# Patient Record
Sex: Male | Born: 1965 | Race: Asian | Hispanic: No | Marital: Single | Smoking: Never smoker
Health system: Southern US, Community
[De-identification: ages and names within clinical notes are randomized; demographics above are authoritative.]

## PROBLEM LIST (undated history)

## (undated) DIAGNOSIS — E119 Type 2 diabetes mellitus without complications: Secondary | ICD-10-CM

---

## 2000-10-04 ENCOUNTER — Encounter: Admission: RE | Admit: 2000-10-04 | Discharge: 2000-10-04 | Payer: Self-pay | Admitting: *Deleted

## 2000-10-04 ENCOUNTER — Encounter: Payer: Self-pay | Admitting: *Deleted

## 2002-05-20 ENCOUNTER — Encounter: Admission: RE | Admit: 2002-05-20 | Discharge: 2002-05-20 | Payer: Self-pay | Admitting: *Deleted

## 2002-05-20 ENCOUNTER — Encounter: Payer: Self-pay | Admitting: *Deleted

## 2004-06-23 ENCOUNTER — Ambulatory Visit (HOSPITAL_COMMUNITY): Admission: RE | Admit: 2004-06-23 | Discharge: 2004-06-23 | Payer: Self-pay | Admitting: Orthopedic Surgery

## 2004-06-23 ENCOUNTER — Ambulatory Visit (HOSPITAL_BASED_OUTPATIENT_CLINIC_OR_DEPARTMENT_OTHER): Admission: RE | Admit: 2004-06-23 | Discharge: 2004-06-23 | Payer: Self-pay | Admitting: Orthopedic Surgery

## 2018-04-02 ENCOUNTER — Ambulatory Visit (INDEPENDENT_AMBULATORY_CARE_PROVIDER_SITE_OTHER): Payer: 59 | Admitting: Orthopedic Surgery

## 2018-04-02 ENCOUNTER — Ambulatory Visit (INDEPENDENT_AMBULATORY_CARE_PROVIDER_SITE_OTHER): Payer: 59

## 2018-04-02 ENCOUNTER — Encounter (INDEPENDENT_AMBULATORY_CARE_PROVIDER_SITE_OTHER): Payer: Self-pay | Admitting: Orthopedic Surgery

## 2018-04-02 VITALS — Ht 63.0 in | Wt 145.0 lb

## 2018-04-02 DIAGNOSIS — M79671 Pain in right foot: Secondary | ICD-10-CM | POA: Diagnosis not present

## 2018-04-02 DIAGNOSIS — L97511 Non-pressure chronic ulcer of other part of right foot limited to breakdown of skin: Secondary | ICD-10-CM | POA: Insufficient documentation

## 2018-04-02 DIAGNOSIS — M869 Osteomyelitis, unspecified: Secondary | ICD-10-CM | POA: Diagnosis not present

## 2018-04-02 DIAGNOSIS — E1042 Type 1 diabetes mellitus with diabetic polyneuropathy: Secondary | ICD-10-CM | POA: Diagnosis not present

## 2018-04-02 MED ORDER — DOXYCYCLINE HYCLATE 100 MG PO TABS: 100.0000 mg | ORAL_TABLET | Freq: Two times a day (BID) | ORAL | 0 refills | Status: DC

## 2018-04-02 NOTE — Progress Notes (Signed)
Office Visit Note   Patient: Francis Marshall           Date of Birth: 02/15/1966           MRN: [id number] Visit Date: 04/02/2018              Requested by: No referring provider defined for this encounter. PCP: System, Pcp Not In  Chief Complaint  Patient presents with  . Right Foot - Pain      HPI: Patient is a 53 year old gentleman with diabetes who states he developed some ulcer and swelling of the third toe right foot.  Patient states that he put his foot in hot water with Epson salts and he states that this made the toe worse and cause blistering of the skin.  Patient states he does not smoke or drink.  He has a history of type 1 diabetes.  He has no known drug allergies.   Assessment & Plan: Visit Diagnoses:  1. Right foot pain   2. Skin ulcer of toe of right foot, limited to breakdown of skin (HCC)   3. Osteomyelitis of third toe of right foot (HCC)   4. Diabetic polyneuropathy associated with type 1 diabetes mellitus (HCC)     Plan: Patient does have destructive bony changes that are chronic of the third toe tuft consistent with chronic osteomyelitis.  Patient also has radiographic findings of the fifth metatarsal and fifth toe of previous osteomyelitis that is healed.  Patient would like to proceed with conservative treatment at this time we will start him on doxycycline dry dressing changes to the third toe and follow-up in 2 weeks.  Patient may require a third ray amputation.  Follow-Up Instructions: Return in about 2 weeks (around 04/16/2018).   Ortho Exam  Patient is alert, oriented, no adenopathy, well-dressed, normal affect, normal respiratory effort. Examination patient has a strong dorsalis pedis pulse.  Patient has no ulcers around the third toe but there is blistering from burning the toe from hot water.  There is a small blister dorsally over the MTP joint.  The skin is macerated there is no draining ulcer from the chronic osteomyelitis.  There is no sausage digit  swelling.  Imaging: Xr Foot 2 Views Right  Result Date: 04/02/2018 2 view radiographs of the right foot shows old destructive bony changes of the fifth metatarsal and fifth toe secondary to osteomyelitis.  There is also destructive changes of the tuft of the third toe secondary to osteomyelitis.  No images are attached to the encounter.  Labs: No results found for: HGBA1C, ESRSEDRATE, CRP, LABURIC, REPTSTATUS, GRAMSTAIN, CULT, LABORGA   No results found for: ALBUMIN, PREALBUMIN, LABURIC  Body mass index is 25.69 kg/m.  Orders:  Orders Placed This Encounter  Procedures  . XR Foot 2 Views Right   Meds ordered this encounter  Medications  . doxycycline (VIBRA-TABS) 100 MG tablet    Sig: Take 1 tablet (100 mg total) by mouth 2 (two) times daily.    Dispense:  60 tablet    Refill:  0     Procedures: No procedures performed  Clinical Data: No additional findings.  ROS:  All other systems negative, except as noted in the HPI. Review of Systems  Objective: Vital Signs: Ht 5\' 3"  (1.6 m)   Wt 145 lb (65.8 kg)   BMI 25.69 kg/m   Specialty Comments:  No specialty comments available.  PMFS History: Patient Active Problem List   Diagnosis Date Noted  . Osteomyelitis  of third toe of right foot (HCC) 04/02/2018  . Skin ulcer of toe of right foot, limited to breakdown of skin (HCC) 04/02/2018  . Right foot pain 04/02/2018   History reviewed. No pertinent past medical history.  History reviewed. No pertinent family history.  History reviewed. No pertinent surgical history. Social History   Occupational History  . Not on file  Tobacco Use  . Smoking status: Never Smoker  . Smokeless tobacco: Never Used  Substance and Sexual Activity  . Alcohol use: Never    Frequency: Never  . Drug use: Never  . Sexual activity: Never

## 2018-04-08 ENCOUNTER — Telehealth (INDEPENDENT_AMBULATORY_CARE_PROVIDER_SITE_OTHER): Payer: Self-pay | Admitting: Orthopedic Surgery

## 2018-04-08 NOTE — Telephone Encounter (Signed)
Patient called wanting DR Lajoyce Corners to know that his toes and top of his foot has turned black,swollen and draining I forwarded call to triage. Per patient he was in the office last week. Patient number [phone]

## 2018-04-09 ENCOUNTER — Encounter (INDEPENDENT_AMBULATORY_CARE_PROVIDER_SITE_OTHER): Payer: Self-pay | Admitting: Physician Assistant

## 2018-04-09 ENCOUNTER — Ambulatory Visit (INDEPENDENT_AMBULATORY_CARE_PROVIDER_SITE_OTHER): Payer: 59 | Admitting: Physician Assistant

## 2018-04-09 ENCOUNTER — Encounter (INDEPENDENT_AMBULATORY_CARE_PROVIDER_SITE_OTHER): Payer: Self-pay | Admitting: Orthopedic Surgery

## 2018-04-09 VITALS — Ht 63.0 in | Wt 145.0 lb

## 2018-04-09 DIAGNOSIS — E1042 Type 1 diabetes mellitus with diabetic polyneuropathy: Secondary | ICD-10-CM | POA: Diagnosis not present

## 2018-04-09 DIAGNOSIS — M79671 Pain in right foot: Secondary | ICD-10-CM | POA: Diagnosis not present

## 2018-04-09 DIAGNOSIS — M869 Osteomyelitis, unspecified: Secondary | ICD-10-CM

## 2018-04-09 DIAGNOSIS — L97511 Non-pressure chronic ulcer of other part of right foot limited to breakdown of skin: Secondary | ICD-10-CM

## 2018-04-09 MED ORDER — NITROGLYCERIN 0.4 MG/HR TD PT24: 0.4000 mg | MEDICATED_PATCH | Freq: Every day | TRANSDERMAL | 12 refills | Status: AC

## 2018-04-09 MED ORDER — PENTOXIFYLLINE ER 400 MG PO TBCR: 400.0000 mg | EXTENDED_RELEASE_TABLET | Freq: Three times a day (TID) | ORAL | 11 refills | Status: AC

## 2018-04-09 MED ORDER — SULFAMETHOXAZOLE-TRIMETHOPRIM 800-160 MG PO TABS: 1.0000 | ORAL_TABLET | Freq: Two times a day (BID) | ORAL | 0 refills | Status: DC

## 2018-04-09 NOTE — Telephone Encounter (Signed)
Called and pt will come in for an appt today at 1pm with Dr. Lajoyce Corners for eval.

## 2018-04-09 NOTE — Progress Notes (Signed)
Office Visit Note   Patient: Francis Marshall           Date of Birth: 1965/07/09           MRN: [id number] Visit Date: 04/09/2018              Requested by: No referring provider defined for this encounter. PCP: System, Pcp Not In  Chief Complaint  Patient presents with  . Right Foot - Pain, Follow-up      HPI: The patient is a 53 year old gentleman here with his son for follow-up of his right foot.  He is a type I diabetic with polyneuropathy.  He was seen a week ago with ulceration and swelling of the third toe of the right foot.  He had put his foot in hot water with Epson salts and felt like this made it worse and caused some blistering.  We started him on some doxycycline and recommended elevation and offloading as much as possible.  He called today with worsening with increased black necrotic appearing tissue.  He reports he has had worsening redness and blistering which started about 3 days ago.  He has been taking his doxycycline as ordered.  He has been working and reports that the foot hurts more when it is down and dependent and feels better when it is elevated.  He reports a blood sugar of 134 this morning.  Assessment & Plan: Visit Diagnoses:  1. Right foot pain   2. Skin ulcer of toe of right foot, limited to breakdown of skin (HCC)   3. Osteomyelitis of third toe of right foot (HCC)   4. Diabetic polyneuropathy associated with type 1 diabetes mellitus (HCC)     Plan: The patient was seen with Dr. Lajoyce Cornersuda and Dr. Lajoyce Cornersuda ruptured the blisters over the right dorsal foot with a #15 blade knife under sterile techniques and then cultures were obtained.  We will discontinue his doxycycline and switch him to Septra DS 1 p.o. twice daily and will await culture results.  Also start Trental 400 mg p.o. 3 times daily and nitroglycerin patches 1 daily to the right foot with alternating the sites and this was discussed with the patient.  He should continue to wash the foot with Dial soap and water  daily and apply a dry gauze dressing and Ace wrapping.  He should elevate as much as possible.  He was given a note to remain out of work until we see him in follow-up next week.  He will follow-up next week.  Follow-Up Instructions: Return in about 1 week (around 04/16/2018).   Ortho Exam  Patient is alert, oriented, no adenopathy, well-dressed, normal affect, normal respiratory effort. Right foot -there is worsening of the erythema and edema involving the second third and fourth toes.  There is also increased erythema and black ischemic type changes of the skin over the dorsal surface of the toes and distal foot.  He has a good readily palpable dorsalis pedis and posterior tibialis pulse but his toes are more cool distally.  In comparison with his left foot the pulses are not as strong.  Imaging: No results found.   Labs: No results found for: HGBA1C, ESRSEDRATE, CRP, LABURIC, REPTSTATUS, GRAMSTAIN, CULT, LABORGA   No results found for: ALBUMIN, PREALBUMIN, LABURIC  Body mass index is 25.69 kg/m.  Orders:  Orders Placed This Encounter  Procedures  . WOUND CULTURE   Meds ordered this encounter  Medications  . sulfamethoxazole-trimethoprim (BACTRIM DS,SEPTRA DS) 800-160 MG  tablet    Sig: Take 1 tablet by mouth 2 (two) times daily.    Dispense:  28 tablet    Refill:  0  . pentoxifylline (TRENTAL) 400 MG CR tablet    Sig: Take 1 tablet (400 mg total) by mouth 3 (three) times daily with meals.    Dispense:  90 tablet    Refill:  11  . nitroGLYCERIN (NITRODUR - DOSED IN MG/24 HR) 0.4 mg/hr patch    Sig: Place 1 patch (0.4 mg total) onto the skin daily. Apply to right foot and alternate sites daily    Dispense:  30 patch    Refill:  12     Procedures: No procedures performed  Clinical Data: No additional findings.  ROS:  All other systems negative, except as noted in the HPI. Review of Systems  Objective: Vital Signs: Ht 5\' 3"  (1.6 m)   Wt 145 lb (65.8 kg)   BMI  25.69 kg/m   Specialty Comments:  No specialty comments available.  PMFS History: Patient Active Problem List   Diagnosis Date Noted  . 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 Skin ulcer of toe of right foot, limited to breakdown of skin (HCC) 04/02/2018  . Right foot pain 04/02/2018   History reviewed. No pertinent past medical history.  History reviewed. No pertinent family history.  History reviewed. No pertinent surgical history. Social History   Occupational History  . Not on file  Tobacco Use  . Smoking status: Never Smoker  . Smokeless tobacco: Never Used  Substance and Sexual Activity  . Alcohol use: Never    Frequency: Never  . Drug use: Never  . Sexual activity: Never

## 2018-04-12 LAB — WOUND CULTURE
MICRO NUMBER:: 52961
SPECIMEN QUALITY:: ADEQUATE

## 2018-04-18 ENCOUNTER — Ambulatory Visit (INDEPENDENT_AMBULATORY_CARE_PROVIDER_SITE_OTHER): Payer: 59 | Admitting: Orthopedic Surgery

## 2018-04-18 ENCOUNTER — Encounter (INDEPENDENT_AMBULATORY_CARE_PROVIDER_SITE_OTHER): Payer: Self-pay | Admitting: Orthopedic Surgery

## 2018-04-18 VITALS — Ht 63.0 in | Wt 145.0 lb

## 2018-04-18 DIAGNOSIS — L97511 Non-pressure chronic ulcer of other part of right foot limited to breakdown of skin: Secondary | ICD-10-CM | POA: Diagnosis not present

## 2018-04-18 MED ORDER — SULFAMETHOXAZOLE-TRIMETHOPRIM 800-160 MG PO TABS: 1.0000 | ORAL_TABLET | Freq: Two times a day (BID) | ORAL | 0 refills | Status: DC

## 2018-04-22 ENCOUNTER — Encounter (INDEPENDENT_AMBULATORY_CARE_PROVIDER_SITE_OTHER): Payer: Self-pay | Admitting: Orthopedic Surgery

## 2018-04-22 ENCOUNTER — Ambulatory Visit (INDEPENDENT_AMBULATORY_CARE_PROVIDER_SITE_OTHER): Payer: 59 | Admitting: Orthopedic Surgery

## 2018-04-22 VITALS — Ht 63.0 in | Wt 145.0 lb

## 2018-04-22 DIAGNOSIS — L97511 Non-pressure chronic ulcer of other part of right foot limited to breakdown of skin: Secondary | ICD-10-CM | POA: Diagnosis not present

## 2018-04-22 NOTE — Progress Notes (Signed)
   Office Visit Note   Patient: Francis Marshall           Date of Birth: [birth year]           MRN: [id number] Visit Date: 04/22/2018              Requested by: No referring provider defined for this encounter. PCP: System, Pcp Not In  Chief Complaint  Patient presents with  . Right Foot - Follow-up      HPI: Patient is a 53 year old gentleman who had blistering and ulceration on the dorsum of the right foot that was getting worse despite antibiotics.  We did prescribe Trental and a nitroglycerin patch but he is currently not using this.  He is currently on Septra DS twice a day.  Patient's wound cultures were negative.  Patient's wound has shown excellent improvement since he has started wearing the Vive compression sock.  Assessment & Plan: Visit Diagnoses:  1. Skin ulcer of toe of right foot, limited to breakdown of skin (HCC)     Plan: Patient will stay out of work for 2 more weeks continue with the Vive compression stocking and continue with the Septra DS.  With patient's improvement with using the compression sock the ulceration may have been secondary to pyoderma gangrenosum.  If patient has recurrent problems would consider a cortisone cream.  Follow-Up Instructions: Return in about 2 weeks (around 05/06/2018).   Ortho Exam  Patient is alert, oriented, no adenopathy, well-dressed, normal affect, normal respiratory effort. Examination patient has a good dorsalis pedis pulse.  Patient has excellent epithelialization where the blisters have dried out.  He has no tenderness at this time no pain no swelling the blisters have completely resolved.  Imaging: No results found.   Labs: No results found for: HGBA1C, ESRSEDRATE, CRP, LABURIC, REPTSTATUS, GRAMSTAIN, CULT, LABORGA   No results found for: ALBUMIN, PREALBUMIN, LABURIC  Body mass index is 25.69 kg/m.  Orders:  No orders of the defined types were placed in this encounter.  No orders of the defined types were placed in  this encounter.    Procedures: No procedures performed  Clinical Data: No additional findings.  ROS:  All other systems negative, except as noted in the HPI. Review of Systems  Objective: Vital Signs: Ht 5\' 3"  (1.6 m)   Wt 145 lb (65.8 kg)   BMI 25.69 kg/m   Specialty Comments:  No specialty comments available.  PMFS History: Patient Active Problem List   Diagnosis Date Noted  . 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 Skin ulcer of toe of right foot, limited to breakdown of skin (HCC) 04/02/2018  . Right foot pain 04/02/2018   History reviewed. No pertinent past medical history.  History reviewed. No pertinent family history.  History reviewed. No pertinent surgical history. Social History   Occupational History  . Not on file  Tobacco Use  . Smoking status: Never Smoker  . Smokeless tobacco: Never Used  Substance and Sexual Activity  . Alcohol use: Never    Frequency: Never  . Drug use: Never  . Sexual activity: Never

## 2018-04-24 ENCOUNTER — Other Ambulatory Visit (INDEPENDENT_AMBULATORY_CARE_PROVIDER_SITE_OTHER): Payer: Self-pay | Admitting: Orthopedic Surgery

## 2018-04-25 NOTE — Telephone Encounter (Signed)
Does not need further antibiotics

## 2018-04-25 NOTE — Telephone Encounter (Signed)
Did you want the pt to continue?

## 2018-04-29 ENCOUNTER — Encounter (INDEPENDENT_AMBULATORY_CARE_PROVIDER_SITE_OTHER): Payer: Self-pay | Admitting: Orthopedic Surgery

## 2018-04-29 NOTE — Progress Notes (Signed)
   Office Visit Note   Patient: Francis Marshall           Date of Birth: 1965-06-24           MRN: [id number] Visit Date: 04/18/2018              Requested by: No referring provider defined for this encounter. PCP: System, Pcp Not In  Chief Complaint  Patient presents with  . Right Foot - Pain, Follow-up      HPI: Patient is a 53 year old gentleman who presents with recurrent ulceration to the right foot.  Patient states that he stopped taking the doxycycline.  Patient states the pharmacy did not have the Bactrim DS and the pharmacy did not follow-up with him on this prescription.  Patient complains of increasing swelling as well as drainage.  Assessment & Plan: Visit Diagnoses:  1. Skin ulcer of toe of right foot, limited to breakdown of skin (HCC)     Plan: We will reorder a prescription for Bactrim DS.  He will discontinue the Trental and the nitroglycerin patch.  Patient is given a sock to wear.  His symptoms may be more consistent with pyoderma gangrenosum than infection or vascular insufficiency.  Follow-Up Instructions: Return in about 1 week (around 04/25/2018).   Ortho Exam  Patient is alert, oriented, no adenopathy, well-dressed, normal affect, normal respiratory effort. Examination the blistering ulcer on the dorsum of his foot is improving he has palpable pulses we will go ahead and discontinue the Trental and nitroglycerin patch.  Patient is given a compression sock to wear there is superficial epithelialization beneath the blisters that have resolved.  Imaging: No results found. No images are attached to the encounter.  Labs: No results found for: HGBA1C, ESRSEDRATE, CRP, LABURIC, REPTSTATUS, GRAMSTAIN, CULT, LABORGA   No results found for: ALBUMIN, PREALBUMIN, LABURIC  Body mass index is 25.69 kg/m.  Orders:  No orders of the defined types were placed in this encounter.  Meds ordered this encounter  Medications  . sulfamethoxazole-trimethoprim (BACTRIM  DS,SEPTRA DS) 800-160 MG tablet    Sig: Take 1 tablet by mouth 2 (two) times daily.    Dispense:  20 tablet    Refill:  0  . sulfamethoxazole-trimethoprim (BACTRIM DS,SEPTRA DS) 800-160 MG tablet    Sig: Take 1 tablet by mouth 2 (two) times daily.    Dispense:  28 tablet    Refill:  0     Procedures: No procedures performed  Clinical Data: No additional findings.  ROS:  All other systems negative, except as noted in the HPI. Review of Systems  Objective: Vital Signs: Ht 5\' 3"  (1.6 m)   Wt 145 lb (65.8 kg)   BMI 25.69 kg/m   Specialty Comments:  No specialty comments available.  PMFS History: Patient Active Problem List   Diagnosis Date Noted  . 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 Skin ulcer of toe of right foot, limited to breakdown of skin (HCC) 04/02/2018  . Right foot pain 04/02/2018   History reviewed. No pertinent past medical history.  History reviewed. No pertinent family history.  History reviewed. No pertinent surgical history. Social History   Occupational History  . Not on file  Tobacco Use  . Smoking status: Never Smoker  . Smokeless tobacco: Never Used  Substance and Sexual Activity  . Alcohol use: Never    Frequency: Never  . Drug use: Never  . Sexual activity: Never

## 2018-05-01 ENCOUNTER — Telehealth (INDEPENDENT_AMBULATORY_CARE_PROVIDER_SITE_OTHER): Payer: Self-pay | Admitting: Orthopedic Surgery

## 2018-05-01 NOTE — Telephone Encounter (Signed)
Patient called asked if he should continue taking Rx (Sulfamethoxazole)? Patient said he has 1 tab left. The number to contact patient is [phone]

## 2018-05-02 ENCOUNTER — Telehealth (INDEPENDENT_AMBULATORY_CARE_PROVIDER_SITE_OTHER): Payer: Self-pay

## 2018-05-02 NOTE — Telephone Encounter (Signed)
Done

## 2018-05-02 NOTE — Telephone Encounter (Signed)
I called and spoke to patient and informed him to take all medications and we will see him on his next appt. Patient understood.

## 2018-05-07 ENCOUNTER — Encounter (INDEPENDENT_AMBULATORY_CARE_PROVIDER_SITE_OTHER): Payer: Self-pay | Admitting: Physician Assistant

## 2018-05-07 ENCOUNTER — Encounter (INDEPENDENT_AMBULATORY_CARE_PROVIDER_SITE_OTHER): Payer: Self-pay | Admitting: Orthopedic Surgery

## 2018-05-07 ENCOUNTER — Ambulatory Visit (INDEPENDENT_AMBULATORY_CARE_PROVIDER_SITE_OTHER): Payer: 59 | Admitting: Physician Assistant

## 2018-05-07 VITALS — Ht 63.0 in | Wt 145.0 lb

## 2018-05-07 DIAGNOSIS — E1042 Type 1 diabetes mellitus with diabetic polyneuropathy: Secondary | ICD-10-CM

## 2018-05-07 DIAGNOSIS — L97511 Non-pressure chronic ulcer of other part of right foot limited to breakdown of skin: Secondary | ICD-10-CM

## 2018-05-09 ENCOUNTER — Encounter (INDEPENDENT_AMBULATORY_CARE_PROVIDER_SITE_OTHER): Payer: Self-pay | Admitting: Physician Assistant

## 2018-05-09 ENCOUNTER — Telehealth (INDEPENDENT_AMBULATORY_CARE_PROVIDER_SITE_OTHER): Payer: Self-pay | Admitting: Orthopedic Surgery

## 2018-05-09 NOTE — Telephone Encounter (Signed)
Patient called wanted to know if he needs to continue taking pentoxifylline

## 2018-05-09 NOTE — Progress Notes (Signed)
   Office Visit Note   Patient: Francis Marshall           Date of Birth: May 02, 1965           MRN: [id number] Visit Date: 05/07/2018              Requested by: No referring provider defined for this encounter. PCP: System, Pcp Not In  Chief Complaint  Patient presents with  . Right Foot - Follow-up      HPI: Patient is a 53 year old gentleman with type 1 diabetes with polyneuropathy who had presented with blistering and ulceration over the dorsum of his right foot.  He is on Bactrim DS and completed the course.  He reports he has been wearing his silver compression sock.  He reports the pain is resolved and he would like to return to work.  He has been wearing a regular shoe.  He is otherwise feeling well and has no residual concerns.  Assessment & Plan: Visit Diagnoses:  1. Skin ulcer of toe of right foot, limited to breakdown of skin (HCC)   2. Diabetic polyneuropathy associated with type 1 diabetes mellitus (HCC)     Plan: Counseled the patient to continue to wear his compression sock as instructed on the right foot.  He may return to work tomorrow and was given a note for this.  Instructed the patient to follow-up as needed should he have recurrent ulceration or other concerns over the right foot.  Follow-Up Instructions: Return if symptoms worsen or fail to improve.   Ortho Exam  Patient is alert, oriented, no adenopathy, well-dressed, normal affect, normal respiratory effort. Right foot blisters and ulcerations have resolved.  There is chronic discoloration of the right foot but decreased swelling.  There is no drainage.  He has palpable pedal pulses.  Imaging: No results found.   Labs: No results found for: HGBA1C, ESRSEDRATE, CRP, LABURIC, REPTSTATUS, GRAMSTAIN, CULT, LABORGA   No results found for: ALBUMIN, PREALBUMIN, LABURIC  Body mass index is 25.69 kg/m.  Orders:  No orders of the defined types were placed in this encounter.  No orders of the defined types were  placed in this encounter.    Procedures: No procedures performed  Clinical Data: No additional findings.  ROS:  All other systems negative, except as noted in the HPI. Review of Systems  Objective: Vital Signs: Ht 5\' 3"  (1.6 m)   Wt 145 lb (65.8 kg)   BMI 25.69 kg/m   Specialty Comments:  No specialty comments available.  PMFS History: Patient Active Problem List   Diagnosis Date Noted  . 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 Skin ulcer of toe of right foot, limited to breakdown of skin (HCC) 04/02/2018  . Right foot pain 04/02/2018   History reviewed. No pertinent past medical history.  History reviewed. No pertinent family history.  History reviewed. No pertinent surgical history. Social History   Occupational History  . Not on file  Tobacco Use  . Smoking status: Never Smoker  . Smokeless tobacco: Never Used  Substance and Sexual Activity  . Alcohol use: Never    Frequency: Never  . Drug use: Never  . Sexual activity: Never

## 2018-05-10 NOTE — Telephone Encounter (Signed)
Called and lm on vm to advise that he should continue with trental and to call with any questions.

## 2018-05-10 NOTE — Telephone Encounter (Signed)
Pt was seen for a healed right foot ulcer. Please advise if he needs to continue with his trental.

## 2018-05-10 NOTE — Telephone Encounter (Signed)
Yes, I would have him to continue.

## 2018-10-28 ENCOUNTER — Encounter: Payer: Self-pay | Admitting: Orthopedic Surgery

## 2018-10-28 ENCOUNTER — Ambulatory Visit (INDEPENDENT_AMBULATORY_CARE_PROVIDER_SITE_OTHER): Payer: Self-pay

## 2018-10-28 ENCOUNTER — Ambulatory Visit (INDEPENDENT_AMBULATORY_CARE_PROVIDER_SITE_OTHER): Payer: Self-pay | Admitting: Orthopedic Surgery

## 2018-10-28 ENCOUNTER — Other Ambulatory Visit: Payer: Self-pay

## 2018-10-28 DIAGNOSIS — M79671 Pain in right foot: Secondary | ICD-10-CM
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
## 2019-07-02 ENCOUNTER — Emergency Department (HOSPITAL_COMMUNITY): Payer: PRIVATE HEALTH INSURANCE

## 2019-07-02 ENCOUNTER — Other Ambulatory Visit: Payer: Self-pay

## 2019-07-02 ENCOUNTER — Emergency Department (HOSPITAL_COMMUNITY)
Admission: EM | Admit: 2019-07-02 | Discharge: 2019-07-02 | Disposition: A | Discharge status: Home / Self Care | Payer: PRIVATE HEALTH INSURANCE | Attending: Emergency Medicine | Admitting: Emergency Medicine

## 2019-07-02 ENCOUNTER — Encounter (HOSPITAL_COMMUNITY): Payer: Self-pay | Admitting: Emergency Medicine

## 2019-07-02 DIAGNOSIS — W2209XA Striking against other stationary object, initial encounter: Secondary | ICD-10-CM | POA: Diagnosis not present

## 2019-07-02 DIAGNOSIS — E119 Type 2 diabetes mellitus without complications: Secondary | ICD-10-CM | POA: Insufficient documentation

## 2019-07-02 DIAGNOSIS — Y990 Civilian activity done for income or pay: Secondary | ICD-10-CM | POA: Insufficient documentation

## 2019-07-02 DIAGNOSIS — Y939 Activity, unspecified: Secondary | ICD-10-CM | POA: Insufficient documentation

## 2019-07-02 DIAGNOSIS — Z79899 Other long term (current) drug therapy: Secondary | ICD-10-CM | POA: Insufficient documentation

## 2019-07-02 DIAGNOSIS — S61210A Laceration without foreign body of right index finger without damage to nail, initial encounter: Secondary | ICD-10-CM | POA: Insufficient documentation

## 2019-07-02 DIAGNOSIS — S62630A Displaced fracture of distal phalanx of right index finger, initial encounter for closed fracture: Secondary | ICD-10-CM | POA: Insufficient documentation

## 2019-07-02 DIAGNOSIS — Y9289 Other specified places as the place of occurrence of the external cause: Secondary | ICD-10-CM | POA: Insufficient documentation

## 2019-07-02 HISTORY — DX: Type 2 diabetes mellitus without complications: E11.9

## 2019-07-02 MED ORDER — LIDOCAINE HCL (PF) 1 % IJ SOLN
5.0000 mL | Freq: Once | INTRAMUSCULAR | Status: AC
  Administered 2019-07-02: 16:00:00 5 mL via INTRADERMAL
  Filled 2019-07-02: qty 5

## 2019-07-02 MED ORDER — TETANUS-DIPHTH-ACELL PERTUSSIS 5-2.5-18.5 LF-MCG/0.5 IM SUSP
0.5000 mL | Freq: Once | INTRAMUSCULAR | Status: DC
  Filled 2019-07-02: qty 0.5

## 2019-07-02 MED ORDER — CEPHALEXIN 500 MG PO CAPS: 500.0000 mg | ORAL_CAPSULE | Freq: Two times a day (BID) | ORAL | 0 refills | Status: AC
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
## 2019-07-02 NOTE — ED Notes (Signed)
Verbalized understanding of DC instructions, Rx, follow up care with hand 

## 2019-07-02 NOTE — Discharge Instructions (Signed)
I have placed 3 stitches to your right index finger, these will need to be removed within 7 to 10 days.  The number for Dr. Karle Barr is attached to your chart, please schedule an appointment for follow-up with a hand specialist.  I have also prescribed a short course of antibiotics, please take 1 tablet twice a day for the next 7 days.

## 2019-07-02 NOTE — ED Provider Notes (Signed)
[facility] EMERGENCY DEPARTMENT Provider Note   CSN: [id number] Arrival date & time: 07/02/19  1413     History Chief Complaint  Patient presents with  . Finger Injury    Francis Marshall is a 54 y.o. male.  54 y.o male with a PMH of DM presents to the ED s/p finger injury x prior to arrival. 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  Reports bleeding was controlled at the time it occurred with pressure.  [facility], had an x-ray which showed an open fracture and referred to the ED for further evaluation.  On arrival patient is able to minimally flex his right index finger.  Bleeding is controlled at the time of injury. No other injuries, last tetanus vaccine in the past three years.   The history is provided by the patient and medical records.       Past Medical History:  Diagnosis Date  . Diabetes mellitus without complication [facility])     Patient Active Problem List   Diagnosis Date Noted  . 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 Skin ulcer of toe of right foot, limited to breakdown of skin (HCC) 04/02/2018  . Right foot pain 04/02/2018    History reviewed. No pertinent surgical history.     No family history on file.  Social History   Tobacco Use  . Smoking status: Never Smoker  . Smokeless tobacco: Never Used  Substance Use Topics  . Alcohol use: Never  . Drug use: Never    Home Medications Prior to Admission medications   Medication Sig Start Date End Date Taking? Authorizing Provider  cephALEXin (KEFLEX) 500 MG capsule Take 1 capsule (500 mg total) by mouth 2 (two) times daily for 7 days. 07/02/19 07/09/19  Claude Manges, PA-C  nitroGLYCERIN (NITRODUR - DOSED IN MG/24 HR) 0.4 mg/hr patch Place 1 patch (0.4 mg total) onto the skin daily. Apply to right foot and alternate sites daily 04/09/18    Rayburn, Fanny Bien, PA-C  pentoxifylline (TRENTAL) 400 MG CR tablet Take 1 tablet (400 mg total) by mouth 3 (three) times daily with meals. 04/09/18   Rayburn, Fanny Bien, PA-C  sulfamethoxazole-trimethoprim (BACTRIM DS,SEPTRA DS) 800-160 MG tablet Take 1 tablet by mouth 2 (two) times daily. 04/18/18   Nadara Mustard, MD  sulfamethoxazole-trimethoprim (BACTRIM DS,SEPTRA DS) 800-160 MG tablet Take 1 tablet by mouth 2 (two) times daily. 04/18/18   Nadara Mustard, MD    Allergies    Patient has no known allergies.  Review of Systems   Review of Systems  Constitutional: Negative for fever.  Musculoskeletal: Negative for back pain and myalgias.  Skin: Positive for wound.  All other systems reviewed and are negative.   Physical Exam Updated Vital Signs BP (!) 154/80 (BP Location: Left Arm)   Pulse 90   Temp 98.3 F (36.8 C) (Oral)   Resp 14   Ht 5\' 4"  (1.626 m)   Wt 61.7 kg   SpO2 95%   BMI 23.34 kg/m   Physical Exam Vitals and nursing note reviewed.  Constitutional:      Appearance: Normal appearance.  HENT:     Head: Normocephalic and atraumatic.     Nose: Nose normal.     Mouth/Throat:     Mouth: Mucous membranes are moist.  Cardiovascular:  Rate and Rhythm: Normal rate.  Pulmonary:     Effort: Pulmonary effort is normal.     Breath sounds: No wheezing or rales.  Abdominal:     General: Abdomen is flat.     Tenderness: There is no abdominal tenderness. There is no right CVA tenderness or left CVA tenderness.  Musculoskeletal:     Right hand: Laceration and tenderness present. No swelling, deformity or bony tenderness. Normal strength. Normal sensation. There is no disruption of two-point discrimination. Normal capillary refill. Normal pulse.     Cervical back: Normal range of motion and neck supple.     Comments: Pulses present, full ROM with pain. Capillary refill intact. Please see photos attached.   Skin:    General: Skin is warm and dry.    Neurological:     Mental Status: He is alert and oriented to person, place, and time.         ED Results / Procedures / Treatments   Labs (all labs ordered are listed, but only abnormal results are displayed) Labs Reviewed - No data to display  EKG None  Radiology DG Finger Index Right  Result Date: 07/02/2019 CLINICAL DATA:  Fracture. Cut index finger tip on industrial machine at work today. EXAM: RIGHT INDEX FINGER 2+V COMPARISON:  None. FINDINGS: Displaced distal tuft fracture. No intra-articular extension. Associated skin irregularity. Remainder the digit is intact. No radiopaque foreign body. IMPRESSION: Displaced distal tuft fracture with adjacent skin irregularity. Electronically Signed   By: Keith Rake M.D.   On: 07/02/2019 15:06    Procedures .Marland KitchenLaceration Repair  Date/Time: 07/02/2019 3:53 PM Performed by: Janeece Fitting, PA-C Authorized by: Janeece Fitting, PA-C   Consent:    Consent obtained:  Verbal   Consent given by:  Patient   Risks discussed:  Infection, pain and poor cosmetic result Anesthesia (see MAR for exact dosages):    Anesthesia method:  Local infiltration   Local anesthetic:  Lidocaine 1% w/o epi Laceration details:    Location:  Finger   Finger location:  R index finger   Length (cm):  1   Depth (mm):  1 Repair type:    Repair type:  Simple Exploration:    Hemostasis achieved with:  Direct pressure Treatment:    Area cleansed with:  Saline   Amount of cleaning:  Extensive Skin repair:    Repair method:  Sutures   Suture size:  4-0   Suture material:  Prolene   Suture technique:  Simple interrupted   Number of sutures:  3 Approximation:    Approximation:  Close Post-procedure details:    Dressing:  Splint for protection   Patient tolerance of procedure:  Tolerated well, no immediate complications   (including critical care time)  Medications Ordered in ED Medications  lidocaine (PF) (XYLOCAINE) 1 % injection 5 mL (has no  administration in time range)    ED Course  I have reviewed the triage vital signs and the nursing notes.  Pertinent labs & imaging results that were available during my care of the patient were reviewed by me and considered in my medical decision making (see chart for details).    MDM Rules/Calculators/A&P   Patient with no pertinent past medical history presents to the ED status post laceration while at work.  Reports missing the clothing machine line and accidentally putting his right index finger through it.  Bleeding is controlled at the time of the incident.  [facility] and had [facility] performed  which showed an open fracture, referred to the ED for further evaluation.  During my evaluation small laceration noted to the medial aspect of his right index finger, bleeding is controlled, he is neurovascularly intact.  Does have pain with flexion of the DIP.  X-ray of his right index finger obtained showed: Displaced distal tuft fracture with adjacent skin irregularity.   Spoke to Earney Hamburg of orthopedics who recommended primary closure along with follow-up with Dr. Arita Miss hand specialist on call.  I have personally suture patient's laceration together, I have placed 3 stitches to his right index finger.  He was advised to follow-up with hand specialist, he was also placed on antibiotics in order to prevent any infection.  Return precautions discussed at length.  Patient placed in a bulky splint by me.  Patient stable for discharge.   Portions of this note were generated with Scientist, clinical (histocompatibility and immunogenetics). Dictation errors may occur despite best attempts at proofreading.  Final Clinical Impression(s) / ED Diagnoses Final diagnoses:  Laceration of right index finger without foreign body without damage to nail, initial encounter    Rx / DC Orders ED Discharge Orders         Ordered    cephALEXin (KEFLEX) 500 MG capsule  2 times daily     07/02/19 1553           Claude Manges, PA-C 07/02/19 1556    Tegeler, Canary Brim, MD 07/02/19 1609

## 2019-07-09 ENCOUNTER — Encounter: Payer: Self-pay | Admitting: Plastic Surgery

## 2019-07-09 ENCOUNTER — Other Ambulatory Visit: Payer: Self-pay

## 2019-07-09 ENCOUNTER — Ambulatory Visit (INDEPENDENT_AMBULATORY_CARE_PROVIDER_SITE_OTHER): Payer: PRIVATE HEALTH INSURANCE | Admitting: Plastic Surgery

## 2019-07-09 VITALS — BP 100/64 | HR 87 | Temp 98.2°F | Ht 64.0 in | Wt 136.0 lb

## 2019-07-09 DIAGNOSIS — S62630A Displaced fracture of distal phalanx of right index finger, initial encounter for closed fracture: Secondary | ICD-10-CM | POA: Diagnosis not present

## 2019-07-09 NOTE — Progress Notes (Signed)
Referring Provider No referring provider defined for this encounter.   CC:  Chief Complaint  Patient presents with  . Consult    laceration of the right index finger      Francis Marshall is an 54 y.o. male.  HPI: Patient is here for follow-up from the emergency room after crush injury to his right index fingertip.  This happened while at work.  He sustained a tuft fracture along with a small laceration on the lateral aspect of the finger and a small subungual hematoma.  Sutures were placed in the emergency room and he was sent here for follow-up.  Patient has some pain focused around the area of injury but has no other signs of trauma or other areas of his hand that bother him.  No Known Allergies  Outpatient Encounter Medications as of 07/09/2019  Medication Sig  . atorvastatin (LIPITOR) 10 MG tablet Take 10 mg by mouth daily.  Marland Kitchen glipiZIDE (GLUCOTROL XL) 10 MG 24 hr tablet Take 20 mg by mouth daily.  . INVOKANA 100 MG TABS tablet Take 100 mg by mouth daily.  Marland Kitchen lisinopril (ZESTRIL) 10 MG tablet Take 10 mg by mouth daily.  . metFORMIN (GLUCOPHAGE-XR) 500 MG 24 hr tablet Take 1,000 mg by mouth 2 (two) times daily.  Glory Rosebush Delica Lancets 95K MISC TEST BLOOD SUGARS DAILY, AS DIRECTED DX E11.9  . pentoxifylline (TRENTAL) 400 MG CR tablet Take 1 tablet (400 mg total) by mouth 3 (three) times daily with meals.  . pioglitazone (ACTOS) 45 MG tablet Take 45 mg by mouth daily.  Marland Kitchen sulfamethoxazole-trimethoprim (BACTRIM DS,SEPTRA DS) 800-160 MG tablet Take 1 tablet by mouth 2 (two) times daily.  . cephALEXin (KEFLEX) 500 MG capsule Take 1 capsule (500 mg total) by mouth 2 (two) times daily for 7 days.  . nitroGLYCERIN (NITRODUR - DOSED IN MG/24 HR) 0.4 mg/hr patch Place 1 patch (0.4 mg total) onto the skin daily. Apply to right foot and alternate sites daily (Patient not taking: Reported on 07/09/2019)  . sulfamethoxazole-trimethoprim (BACTRIM DS,SEPTRA DS) 800-160 MG tablet Take 1 tablet by mouth 2  (two) times daily.   No facility-administered encounter medications on file as of 07/09/2019.     Past Medical History:  Diagnosis Date  . Diabetes mellitus without complication ([place])     No past surgical history on file.  No family history on file.  Social History   Social History Narrative  . Not on file     Review of Systems General: Denies fevers, chills, weight loss CV: Denies chest pain, shortness of breath, palpitations  Physical Exam Vitals with BMI 07/09/2019 07/02/2019 05/07/2018  Height 5\' 4"  5\' 4"  5\' 3"   Weight 136 lbs 136 lbs 145 lbs  BMI 23.33 93.26 71.24  Systolic 580 998 -  Diastolic 64 80 -  Pulse 87 90 -    General:  No acute distress,  Alert and oriented, Non-Toxic, Normal speech and affect Right hand: Fingers well-perfused normal cap refill palp radial pulse.  Sensation is intact throughout.  He has full range of motion except for the DIP joint of the index finger which has limited passive and active range of motion due to pain and swelling.  He has a small subungual hematoma approximately that takes up less than 25% of the nailbed.  There is a small laceration extending laterally from the nail plate that has 3 Prolene sutures in place with no signs of infection.  He has mild swelling proximal to the  eponychial fold but again no signs of erythema or infection.  His x-ray was reviewed showing a displaced tuft fracture with small fragments that could not be reduced or stabilized surgically.  Assessment/Plan Patient presents after crush injury to the right index finger.  I think he will go on to do fine.  He has good range of motion everywhere except for the DIP joint.  I have offered to send him to physical therapy for a Splint but he declined.  I offered to see him next week to take his sutures out please get I see if these can be taken out at work by a nurse that they have there.  Have asked him to pad the finger with a Coban wrap for the next few weeks to  stabilize it and protect it as it is still a bit sensitive.  Plan to see him next week if he would like or can follow-up with him further down the line if he thinks it is necessary.  Allena Napoleon 07/09/2019, 5:09 PM

## 2019-12-15 ENCOUNTER — Encounter: Payer: Self-pay | Admitting: Orthopedic Surgery

## 2019-12-15 ENCOUNTER — Ambulatory Visit: Payer: Self-pay

## 2019-12-15 ENCOUNTER — Ambulatory Visit (INDEPENDENT_AMBULATORY_CARE_PROVIDER_SITE_OTHER): Payer: 59 | Admitting: Orthopedic Surgery

## 2019-12-15 VITALS — Ht 64.0 in | Wt 136.0 lb

## 2019-12-15 DIAGNOSIS — L089 Local infection of the skin and subcutaneous tissue, unspecified: Secondary | ICD-10-CM

## 2019-12-15 DIAGNOSIS — M79671 Pain in right foot: Secondary | ICD-10-CM

## 2019-12-15 MED ORDER — DOXYCYCLINE HYCLATE 100 MG PO TABS: 100.0000 mg | ORAL_TABLET | Freq: Two times a day (BID) | ORAL | 0 refills | Status: DC

## 2019-12-16 ENCOUNTER — Encounter: Payer: Self-pay | Admitting: Orthopedic Surgery

## 2019-12-16 NOTE — Progress Notes (Signed)
Office Visit Note   Patient: Francis Marshall           Date of Birth: 11/29/1965           MRN: [id number] Visit Date: 12/15/2019              Requested by: No referring provider defined for this encounter. PCP: Pcp, No  Chief Complaint  Patient presents with  . Right Foot - Pain      HPI: Patient is a 54 year old gentleman who was seen about a year ago for ulceration right foot.  Patient had done well but he states he now has an acute recurrent ulcer beneath the fourth metatarsal head right foot with macerated skin between the third and fourth toes.  Patient states this has been getting worse over the past 2 weeks.  Assessment & Plan: Visit Diagnoses:  1. Pain in right foot   2. Infection of right foot     Plan: Recommend patient continue with the Trental nitroglycerin patch and a prescription for doxycycline is called in.  Follow-Up Instructions: Return in about 1 week (around 12/22/2019).   Ortho Exam  Patient is alert, oriented, no adenopathy, well-dressed, normal affect, normal respiratory effort. Examination patient does have a palpable dorsalis pedis pulse there is no swelling no cellulitis there is maceration between the third and fourth toes but no open wound.  There is a pressure ulcer beneath the fourth metatarsal head.  After informed consent a 10 blade knife was used to pare the callus this ulcer is superficial the opening is approximately 2 mm in diameter 1 mm deep there is no abscess this does not probe to bone.  Imaging: XR Foot Complete Right  Result Date: 12/16/2019 Three-view radiographs of the right foot shows a previous Weil osteotomy screw in the fourth metatarsal.  There is no destructive bony changes no air in the soft tissue.  No images are attached to the encounter.  Labs: No results found for: HGBA1C, ESRSEDRATE, CRP, LABURIC, REPTSTATUS, GRAMSTAIN, CULT, LABORGA   No results found for: ALBUMIN, PREALBUMIN, LABURIC  No results found for: MG No  results found for: VD25OH  No results found for: PREALBUMIN No flowsheet data found.   Body mass index is 23.34 kg/m.  Orders:  Orders Placed This Encounter  Procedures  . XR Foot Complete Right   Meds ordered this encounter  Medications  . doxycycline (VIBRA-TABS) 100 MG tablet    Sig: Take 1 tablet (100 mg total) by mouth 2 (two) times daily.    Dispense:  30 tablet    Refill:  0     Procedures: No procedures performed  Clinical Data: No additional findings.  ROS:  All other systems negative, except as noted in the HPI. Review of Systems  Objective: Vital Signs: Ht 5\' 4"  (1.626 m)   Wt 136 lb (61.7 kg)   BMI 23.34 kg/m   Specialty Comments:  No specialty comments available.  PMFS History: Patient Active Problem List   Diagnosis Date Noted  . 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 Skin ulcer of toe of right foot, limited to breakdown of skin (HCC) 04/02/2018  . Right foot pain 04/02/2018   Past Medical History:  Diagnosis Date  . Diabetes mellitus without complication (HCC)     History reviewed. No pertinent family history.  History reviewed. No pertinent surgical history. Social History   Occupational History  . Not on file  Tobacco Use  . Smoking  status: Never Smoker  . Smokeless tobacco: Never Used  Substance and Sexual Activity  . Alcohol use: Never  . Drug use: Never  . Sexual activity: Never

## 2019-12-22 ENCOUNTER — Encounter: Payer: Self-pay | Admitting: Physician Assistant

## 2019-12-22 ENCOUNTER — Ambulatory Visit (INDEPENDENT_AMBULATORY_CARE_PROVIDER_SITE_OTHER): Payer: 59 | Admitting: Physician Assistant

## 2019-12-22 VITALS — Ht 64.0 in | Wt 136.0 lb

## 2019-12-22 DIAGNOSIS — M79671 Pain in right foot: Secondary | ICD-10-CM | POA: Diagnosis not present

## 2019-12-22 NOTE — Progress Notes (Signed)
   Office Visit Note   Patient: Francis Marshall           Date of Birth: [birth year]           MRN: [id number] Visit Date: 12/22/2019              Requested by: No referring provider defined for this encounter. PCP: Pcp, No  Chief Complaint  Patient presents with  . Right Foot - Follow-up      HPI: The patient is a pleasant 54 year old gentleman who follows up for his right foot ulcer.  He currently has been taking antibiotics and doing daily cleansing.  He feels the toe is looking a little bit better callus beneath his foot is about the same  Assessment & Plan: Visit Diagnoses: No diagnosis found.  Plan: Patient will finish up his antibiotics and follow-up in 2 weeks.  I told him that if he began having return of the maceration and swelling he is to follow-up sooner we also talked about better shoewear that was not so flexible with orthotic.  Follow-Up Instructions: No follow-ups on file.   Ortho Exam  Patient is alert, oriented, no adenopathy, well-dressed, normal affect, normal respiratory effort. Focused examination between the third and fourth toes on the right foot the maceration there is no drainage no foul odor no cellulitis or no swelling in the toe.  There is no drainage from the ulcer on the fourth metatarsal head there is a very half centimeter area that does not probe deeply.  There is no surrounding cellulitis  Imaging: No results found. No images are attached to the encounter.  Labs: No results found for: HGBA1C, ESRSEDRATE, CRP, LABURIC, REPTSTATUS, GRAMSTAIN, CULT, LABORGA   No results found for: ALBUMIN, PREALBUMIN, LABURIC  No results found for: MG No results found for: VD25OH  No results found for: PREALBUMIN No flowsheet data found.   Body mass index is 23.34 kg/m.  Orders:  No orders of the defined types were placed in this encounter.  No orders of the defined types were placed in this encounter.    Procedures: No procedures performed  Clinical  Data: No additional findings.  ROS:  All other systems negative, except as noted in the HPI. Review of Systems  Objective: Vital Signs: Ht 5\' 4"  (1.626 m)   Wt 136 lb (61.7 kg)   BMI 23.34 kg/m   Specialty Comments:  No specialty comments available.  PMFS History: Patient Active Problem List   Diagnosis Date Noted  . 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 Skin ulcer of toe of right foot, limited to breakdown of skin (HCC) 04/02/2018  . Right foot pain 04/02/2018   Past Medical History:  Diagnosis Date  . Diabetes mellitus without complication (HCC)     History reviewed. No pertinent family history.  History reviewed. No pertinent surgical history. Social History   Occupational History  . Not on file  Tobacco Use  . Smoking status: Never Smoker  . Smokeless tobacco: Never Used  Substance and Sexual Activity  . Alcohol use: Never  . Drug use: Never  . Sexual activity: Never

## 2020-01-05 ENCOUNTER — Ambulatory Visit (INDEPENDENT_AMBULATORY_CARE_PROVIDER_SITE_OTHER): Payer: 59 | Admitting: Orthopedic Surgery

## 2020-01-05 ENCOUNTER — Encounter: Payer: Self-pay | Admitting: Physician Assistant

## 2020-01-05 VITALS — Ht 64.0 in | Wt 136.0 lb

## 2020-01-05 DIAGNOSIS — L97511 Non-pressure chronic ulcer of other part of right foot limited to breakdown of skin: Secondary | ICD-10-CM | POA: Diagnosis not present

## 2020-01-05 NOTE — Progress Notes (Signed)
   Office Visit Note   Patient: Francis Marshall           Date of Birth: [birth year]           MRN: [id number] Visit Date: 01/05/2020              Requested by: No referring provider defined for this encounter. PCP: Pcp, No  Chief Complaint  Patient presents with  . Right Foot - Follow-up      HPI: Patient is a 54 year old gentleman who has had episodes of recurrent ulcers with infection he recently has had a ulcerative infection of the right foot patient feels like he is getting much better he denies any pain he has completed his antibiotics denies any drainage or swelling.  Assessment & Plan: Visit Diagnoses:  1. Skin ulcer of toe of right foot, limited to breakdown of skin (HCC)     Plan: Patient will advance to regular activities without restrictions follow-up as needed he will discontinue nitroglycerin patch.  Follow-Up Instructions: Return if symptoms worsen or fail to improve.   Ortho Exam  Patient is alert, oriented, no adenopathy, well-dressed, normal affect, normal respiratory effort. Examination patient has a good dorsalis pedis pulse the ulcer beneath the fourth metatarsal head is completely healed there is no redness no cellulitis no drainage no signs of infection.  Imaging: No results found. No images are attached to the encounter.  Labs: No results found for: HGBA1C, ESRSEDRATE, CRP, LABURIC, REPTSTATUS, GRAMSTAIN, CULT, LABORGA   No results found for: ALBUMIN, PREALBUMIN, LABURIC  No results found for: MG No results found for: VD25OH  No results found for: PREALBUMIN No flowsheet data found.   Body mass index is 23.34 kg/m.  Orders:  No orders of the defined types were placed in this encounter.  No orders of the defined types were placed in this encounter.    Procedures: No procedures performed  Clinical Data: No additional findings.  ROS:  All other systems negative, except as noted in the HPI. Review of Systems  Objective: Vital Signs:  Ht 5\' 4"  (1.626 m)   Wt 136 lb (61.7 kg)   BMI 23.34 kg/m   Specialty Comments:  No specialty comments available.  PMFS History: Patient Active Problem List   Diagnosis Date Noted  . 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 Skin ulcer of toe of right foot, limited to breakdown of skin (HCC) 04/02/2018  . Right foot pain 04/02/2018   Past Medical History:  Diagnosis Date  . Diabetes mellitus without complication (HCC)     History reviewed. No pertinent family history.  History reviewed. No pertinent surgical history. Social History   Occupational History  . Not on file  Tobacco Use  . Smoking status: Never Smoker  . Smokeless tobacco: Never Used  Substance and Sexual Activity  . Alcohol use: Never  . Drug use: Never  . Sexual activity: Never

## 2021-07-14 ENCOUNTER — Ambulatory Visit (INDEPENDENT_AMBULATORY_CARE_PROVIDER_SITE_OTHER): Payer: 59 | Admitting: Orthopedic Surgery

## 2021-07-14 ENCOUNTER — Ambulatory Visit (INDEPENDENT_AMBULATORY_CARE_PROVIDER_SITE_OTHER): Payer: 59

## 2021-07-14 ENCOUNTER — Encounter: Payer: Self-pay | Admitting: Family

## 2021-07-14 DIAGNOSIS — L97511 Non-pressure chronic ulcer of other part of right foot limited to breakdown of skin: Secondary | ICD-10-CM | POA: Diagnosis not present

## 2021-07-14 MED ORDER — CEPHALEXIN 500 MG PO CAPS: 500.0000 mg | ORAL_CAPSULE | Freq: Three times a day (TID) | ORAL | 0 refills | Status: DC

## 2021-07-14 MED ORDER — DOXYCYCLINE HYCLATE 100 MG PO TABS: 100.0000 mg | ORAL_TABLET | Freq: Two times a day (BID) | ORAL | 0 refills | Status: DC

## 2021-07-14 NOTE — Progress Notes (Signed)
   Office Visit Note   Patient: Francis Marshall           Date of Birth: 03-Apr-1965           MRN: [id number] Visit Date: 07/14/2021              Requested by: No referring provider defined for this encounter. PCP: Pcp, No  No chief complaint on file.     HPI: Patient is a 56 year old gentleman presents in follow-up for ulceration beneath the right forefoot laterally.  Assessment & Plan: Visit Diagnoses:  1. Skin ulcer of toe of right foot, limited to breakdown of skin ([place])     Plan: Follow-up in 4 weeks.  Continue routine wound care  Follow-Up Instructions: No follow-ups on file.   Ortho Exam  Patient is alert, oriented, no adenopathy, well-dressed, normal affect, normal respiratory effort. Examination patient has a chronic ulcer beneath the fourth and fifth metatarsal heads.  Imaging: No results found. No images are attached to the encounter.  Labs: No results found for: HGBA1C, ESRSEDRATE, CRP, LABURIC, REPTSTATUS, GRAMSTAIN, CULT, LABORGA   No results found for: ALBUMIN, PREALBUMIN, CBC  No results found for: MG No results found for: VD25OH  No results found for: PREALBUMIN     View : No data to display.           There is no height or weight on file to calculate BMI.  Orders:  Orders Placed This Encounter  Procedures   XR Foot Complete Right   No orders of the defined types were placed in this encounter.    Procedures: No procedures performed  Clinical Data: No additional findings.  ROS:  All other systems negative, except as noted in the HPI. Review of Systems  Objective: Vital Signs: There were no vitals taken for this visit.  Specialty Comments:  No specialty comments available.  PMFS History: Patient Active Problem List   Diagnosis Date Noted   Osteomyelitis of third toe of right foot ([place]) 04/02/2018   Skin ulcer of toe of right foot, limited to breakdown of skin ([place]) 04/02/2018   Right foot pain 04/02/2018   Past Medical  History:  Diagnosis Date   Diabetes mellitus without complication ([place])     History reviewed. No pertinent family history.  History reviewed. No pertinent surgical history. Social History   Occupational History   Not on file  Tobacco Use   Smoking status: Never   Smokeless tobacco: Never  Substance and Sexual Activity   Alcohol use: Never   Drug use: Never   Sexual activity: Never

## 2021-07-29 ENCOUNTER — Encounter: Payer: Self-pay | Admitting: Family

## 2021-07-29 ENCOUNTER — Ambulatory Visit (INDEPENDENT_AMBULATORY_CARE_PROVIDER_SITE_OTHER): Payer: 59 | Admitting: Family

## 2021-07-29 DIAGNOSIS — L97511 Non-pressure chronic ulcer of other part of right foot limited to breakdown of skin: Secondary | ICD-10-CM | POA: Diagnosis not present

## 2021-07-29 NOTE — Progress Notes (Signed)
? ?Office Visit Note ?  ?Patient: Francis Marshall           ?Date of Birth: 1965/12/01           ?MRN: [id number] ?Visit Date: 07/29/2021 ?             ?Requested by: No referring provider defined for this encounter. ?PCP: Pcp, No ? ?Chief Complaint  ?Patient presents with  ? Right Foot - Follow-up  ? ? ? ? ? ?HPI: ?The patient is a 56 year old gentleman who presents in follow-up for ulcer beneath the fourth metatarsal head on the right he has been wearing a medical compression stocking with direct skin contact he does use mupirocin as well ? ?Radiographs at last visit were concerning for possible osteomyelitis bony irregularity and the fifth metatarsal shaft and the head of the fourth metatarsal prior hardware seen in the fourth metatarsal head ? ?History of diabetes ? ?Assessment & Plan: ?Visit Diagnoses:  ?No diagnosis found. ? ? ?Plan: He will continue with his current wound care cleaning and drying the area daily mupirocin to the wound he may continue with the medical compression stocking as well discussed concern for or osteomyelitis discussed possible limb salvage intervention the patient would like to discuss this with Dr. Lajoyce Corners.  We will hold off on refilling his antibiotic as today there is no clinical sign of infection he will call or return for any worsening ? ?Follow-Up Instructions: Return in about 17 days (around 08/15/2021), or if symptoms worsen or fail to improve.  ? ?Ortho Exam ? ?Patient is alert, oriented, no adenopathy, well-dressed, normal affect, normal respiratory effort. ?On examination of the right foot beneath the fourth metatarsal head there is callused ulceration that callus is about 15 mm in diameter with central ulceration that is 4 mm in diameter 5 mm deep this does not probe to bone.  There is no surrounding erythema no warmth does have a palpable dorsalis pedis pulse ? ?Imaging: ?No results found. ?No images are attached to the encounter. ? ?Labs: ?No results found for: HGBA1C, ESRSEDRATE,  CRP, LABURIC, REPTSTATUS, GRAMSTAIN, CULT, LABORGA ? ? ?No results found for: ALBUMIN, PREALBUMIN, CBC ? ?No results found for: MG ?No results found for: VD25OH ? ?No results found for: PREALBUMIN ?   ? View : No data to display.  ?  ?  ?  ? ? ? ? ?There is no height or weight on file to calculate BMI. ? ?Orders:  ?No orders of the defined types were placed in this encounter. ? ?No orders of the defined types were placed in this encounter. ? ? ? Procedures: ?No procedures performed ? ?Clinical Data: ?No additional findings. ? ?ROS: ? ?All other systems negative, except as noted in the HPI. ?Review of Systems  ?Constitutional:  Negative for chills and fever.  ?Skin:  Positive for wound. Negative for color change.  ? ?Objective: ?Vital Signs: There were no vitals taken for this visit. ? ?Specialty Comments:  ?No specialty comments available. ? ?PMFS History: ?Patient Active Problem List  ? Diagnosis Date Noted  ? 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 Skin ulcer of toe of right foot, limited to breakdown of skin (HCC) 04/02/2018  ? Right foot pain 04/02/2018  ? ?Past Medical History:  ?Diagnosis Date  ? Diabetes mellitus without complication (HCC)   ?  ?History reviewed. No pertinent family history.  ?History reviewed. No pertinent surgical history. ?Social History  ? ?Occupational History  ? Not  on file  ?Tobacco Use  ? Smoking status: Never  ? Smokeless tobacco: Never  ?Substance and Sexual Activity  ? Alcohol use: Never  ? Drug use: Never  ? Sexual activity: Never  ? ? ? ? ? ?

## 2021-08-01 ENCOUNTER — Telehealth: Payer: Self-pay | Admitting: Family

## 2021-08-01 ENCOUNTER — Telehealth: Payer: Self-pay | Admitting: Orthopedic Surgery

## 2021-08-01 NOTE — Telephone Encounter (Signed)
Pt called asking for a refill of medication. He is unsure of the name but he states Dr. Lajoyce Corners is the on e that prescribed meds. Pt states he think it is antibiotics. Pt is asking for a call back from [place] or [place]. Pt phone number is [phone]. ? ?

## 2021-08-02 ENCOUNTER — Telehealth: Payer: Self-pay | Admitting: Family

## 2021-08-02 MED ORDER — DOXYCYCLINE HYCLATE 100 MG PO TABS: 100.0000 mg | ORAL_TABLET | Freq: Two times a day (BID) | ORAL | 0 refills | Status: AC

## 2021-08-02 NOTE — Telephone Encounter (Signed)
I refilled. Would like to make sure he follows up in 2 weeks with duda. Concerned that he will require amputation for osteo -- since it seems infection has returned

## 2021-08-02 NOTE — Telephone Encounter (Signed)
Francis Marshall you saw him last and looks like he was on doxycycline in the past. ?

## 2021-08-02 NOTE — Telephone Encounter (Signed)
lmtcb

## 2021-08-02 NOTE — Telephone Encounter (Signed)
Best, Crystal L routed conversation to You 22 minutes ago (1:12 PM)  ? ?Best, Crystal L 22 minutes ago (1:12 PM)  ? ?CB ?Pt returned call to [place]. He asked to be called after 4 pm when he is off work. Phone number is [phone]  ?  ? ?

## 2021-08-02 NOTE — Telephone Encounter (Signed)
Pt returned call to [place]. He asked to be called after 4 pm when he is off work. Phone number is [phone] ?

## 2021-08-02 NOTE — Telephone Encounter (Signed)
Copied to other TE I already have open on pt, will close this one. ?

## 2021-08-02 NOTE — Telephone Encounter (Signed)
Advised of medication sent in and made appt  ?

## 2021-08-16 ENCOUNTER — Ambulatory Visit: Payer: 59 | Admitting: Orthopedic Surgery

## 2021-08-24 ENCOUNTER — Encounter: Payer: Self-pay | Admitting: Family

## 2021-08-24 ENCOUNTER — Ambulatory Visit (INDEPENDENT_AMBULATORY_CARE_PROVIDER_SITE_OTHER): Payer: 59 | Admitting: Family

## 2021-08-24 DIAGNOSIS — L97511 Non-pressure chronic ulcer of other part of right foot limited to breakdown of skin: Secondary | ICD-10-CM | POA: Diagnosis not present

## 2021-08-24 NOTE — Progress Notes (Signed)
Office Visit Note   Patient: Francis Marshall           Date of Birth: Aug 20, 1965           MRN: [id number] Visit Date: 08/24/2021              Requested by: No referring provider defined for this encounter. PCP: Pcp, No  Chief Complaint  Patient presents with   Right Foot - Wound Check       HPI: The patient is a 56 year old gentleman who presents in follow-up for ulcer beneath the fourth metatarsal head on the right he has been wearing a medical compression stocking with direct skin contact he does use mupirocin as well  Radiographs are concerning for possible osteomyelitis bony irregularity and the fifth metatarsal shaft and the head of the fourth metatarsal prior hardware seen in the fourth metatarsal head  History of diabetes  Have discussed limb salvage surgery with the patient. He was to follow with Dr. Lajoyce Corners.  Assessment & Plan: Visit Diagnoses:  No diagnosis found.   Plan: He will continue with his current wound care cleansing, daily mupirocin to the wound he may continue with the medical compression stocking as well discussed concern for or osteomyelitis discussed possible limb salvage intervention. Will send for MRI to evaluate 4th ray. Concern for osteomyelitis 5th MT and possibly 4th MT.  Follow-Up Instructions: No follow-ups on file.   Ortho Exam  Patient is alert, oriented, no adenopathy, well-dressed, normal affect, normal respiratory effort. On examination of the right foot beneath the fourth metatarsal head there is callused ulceration that callus is about 15 mm in diameter with central ulceration that is 2 mm in diameter 5 mm deep this does not probe to bone.  There is no surrounding erythema no warmth does have a palpable dorsalis pedis pulse  Imaging: No results found. No images are attached to the encounter.  Labs: No results found for: HGBA1C, ESRSEDRATE, CRP, LABURIC, REPTSTATUS, GRAMSTAIN, CULT, LABORGA   No results found for: ALBUMIN, PREALBUMIN,  CBC  No results found for: MG No results found for: VD25OH  No results found for: PREALBUMIN     View : No data to display.            There is no height or weight on file to calculate BMI.  Orders:  No orders of the defined types were placed in this encounter.  No orders of the defined types were placed in this encounter.    Procedures: No procedures performed  Clinical Data: No additional findings.  ROS:  All other systems negative, except as noted in the HPI. Review of Systems  Constitutional:  Negative for chills and fever.  Skin:  Positive for wound. Negative for color change.   Objective: Vital Signs: There were no vitals taken for this visit.  Specialty Comments:  No specialty comments available.  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 No pertinent family history.  History reviewed. No pertinent surgical history. Social History   Occupational History   Not on file  Tobacco Use   Smoking status: Never   Smokeless tobacco: Never  Substance and Sexual Activity  Alcohol use: Never   Drug use: Never   Sexual activity: Never

## 2021-09-04 ENCOUNTER — Encounter: Payer: Self-pay | Admitting: Family

## 2021-09-09 ENCOUNTER — Ambulatory Visit
Admission: RE | Admit: 2021-09-09 | Discharge: 2021-09-09 | Disposition: A | Discharge status: Home / Self Care | Payer: 59 | Source: Ambulatory Visit | Attending: Family | Admitting: Family

## 2021-09-09 DIAGNOSIS — L97511 Non-pressure chronic ulcer of other part of right foot limited to breakdown of skin: Secondary | ICD-10-CM

## 2021-09-13 ENCOUNTER — Encounter: Payer: Self-pay | Admitting: Orthopedic Surgery

## 2021-09-13 ENCOUNTER — Ambulatory Visit (INDEPENDENT_AMBULATORY_CARE_PROVIDER_SITE_OTHER): Payer: 59 | Admitting: Orthopedic Surgery

## 2021-09-13 DIAGNOSIS — L97511 Non-pressure chronic ulcer of other part of right foot limited to breakdown of skin: Secondary | ICD-10-CM | POA: Diagnosis not present

## 2021-09-13 NOTE — Progress Notes (Signed)
   Office Visit Note   Patient: Francis Marshall           Date of Birth: 12-02-1965           MRN: [id number] Visit Date: 09/13/2021              Requested by: No referring provider defined for this encounter. 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  He is status post MRI scan.  Assessment & Plan: Visit Diagnoses:  1. Right foot ulcer, limited to breakdown of skin (HCC)     Plan: The MRI scan does not show any bony infection recommended a stiff soled sneaker recommended Dr. Margart Sickles donut pads  Follow-Up Instructions: Return in about 4 weeks (around 10/11/2021).   Ortho Exam  Patient is alert, oriented, no adenopathy, well-dressed, normal affect, normal respiratory effort. Examination patient is strong dorsalis pedis pulse he has a 2 mm in diameter 1 mm deep ulcer beneath the fourth metatarsal head he has good dorsiflexion of the ankle he has thin atrophic skin in his foot.  Review of the MRI scan shows no abscess no osteomyelitis.  Imaging: No results found. No images are attached to the encounter.  Labs: No results found for: "HGBA1C", "ESRSEDRATE", "CRP", "LABURIC", "REPTSTATUS", "GRAMSTAIN", "CULT", "LABORGA"   No results found for: "ALBUMIN", "PREALBUMIN", "CBC"  No results found for: "MG" No results found for: "VD25OH"  No results found for: "PREALBUMIN"     No data to display           There is no height or weight on file to calculate BMI.  Orders:  No orders of the defined types were placed in this encounter.  No orders of the defined types were placed in this encounter.    Procedures: No procedures performed  Clinical Data: No additional findings.  ROS:  All other systems negative, except as noted in the HPI. Review of Systems  Objective: Vital Signs: There were no  vitals taken for this visit.  Specialty Comments:  No specialty comments available.  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 No pertinent family history.  History reviewed. No pertinent surgical history. Social History   Occupational History   Not on file  Tobacco Use   Smoking status: Never   Smokeless tobacco: Never  Substance and Sexual Activity   Alcohol use: Never   Drug use: Never   Sexual activity: Never

## 2021-10-13 ENCOUNTER — Ambulatory Visit (INDEPENDENT_AMBULATORY_CARE_PROVIDER_SITE_OTHER): Payer: 59 | Admitting: Orthopedic Surgery

## 2021-10-13 DIAGNOSIS — L97511 Non-pressure chronic ulcer of other part of right foot limited to breakdown of skin: Secondary | ICD-10-CM | POA: Diagnosis not present

## 2021-10-25 ENCOUNTER — Encounter: Payer: Self-pay | Admitting: Orthopedic Surgery

## 2021-10-25 NOTE — Progress Notes (Signed)
   Office Visit Note   Patient: Francis Marshall           Date of Birth: [birth year]           MRN: [id number] Visit Date: 10/13/2021              Requested by: No referring provider defined for this encounter. PCP: Pcp, No  Chief Complaint  Patient presents with   Right Foot - Wound Check      HPI: Patient is a 56 year old gentleman with persistent ulceration beneath the fourth metatarsal head.  Patient is using stiff soled sneakers and a Dr. Margart Sickles pad.  Assessment & Plan: Visit Diagnoses:  1. Right foot ulcer, limited to breakdown of skin (HCC)     Plan: Ulcer was debrided continue with conservative wound care.  Follow-Up Instructions: Return if symptoms worsen or fail to improve.   Ortho Exam  Patient is alert, oriented, no adenopathy, well-dressed, normal affect, normal respiratory effort. Examination patient has a ulcer 2 mm in diameter beneath the fourth metatarsal head this was pared MRI scan obtained on September 09, 2021 is negative for osteomyelitis  Imaging: No results found. No images are attached to the encounter.  Labs: No results found for: "HGBA1C", "ESRSEDRATE", "CRP", "LABURIC", "REPTSTATUS", "GRAMSTAIN", "CULT", "LABORGA"   No results found for: "ALBUMIN", "PREALBUMIN", "CBC"  No results found for: "MG" No results found for: "VD25OH"  No results found for: "PREALBUMIN"     No data to display           There is no height or weight on file to calculate BMI.  Orders:  No orders of the defined types were placed in this encounter.  No orders of the defined types were placed in this encounter.    Procedures: No procedures performed  Clinical Data: No additional findings.  ROS:  All other systems negative, except as noted in the HPI. Review of Systems  Objective: Vital Signs: There were no vitals taken for this visit.  Specialty Comments:  No specialty comments available.  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 No pertinent family history.  History reviewed. No pertinent surgical history. Social History   Occupational History   Not on file  Tobacco Use   Smoking status: Never   Smokeless tobacco: Never  Substance and Sexual Activity   Alcohol use: Never   Drug use: Never   Sexual activity: Never

## 2022-07-02 IMAGING — MR MR FOOT*R* W/O CM
4 of 5 series · 17 of 40 positions shown · non-contrast
Comparison: Radiograph dated July 14, 2021

CLINICAL DATA: Fourth metatarsal head ulcer, rule out
osteomyelitis.

EXAM:
MRI OF THE RIGHT FOREFOOT WITHOUT CONTRAST
TECHNIQUE: Multiplanar, multisequence MR imaging of the right was performed. No
intravenous contrast was administered.

[Series 5: T1 · coronal · 3.0mm · 0.19mm/px · 3 of 44 slices shown (1 of 2)]
[im 5/44]
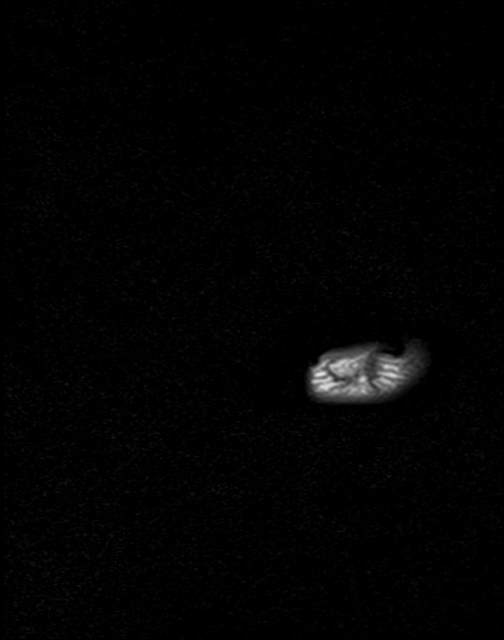
[im 22/44]
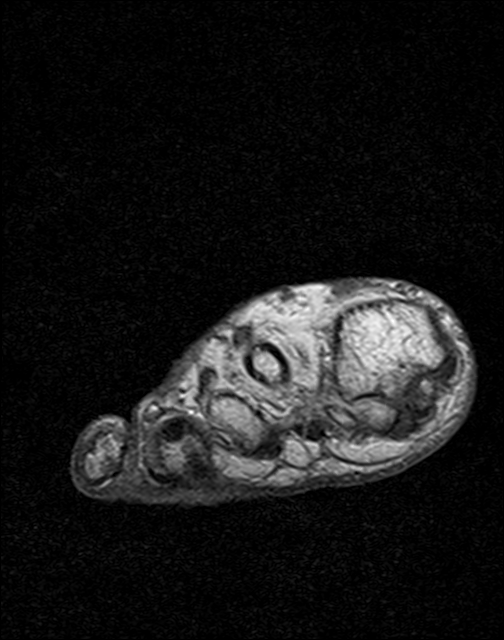
[im 39/44]
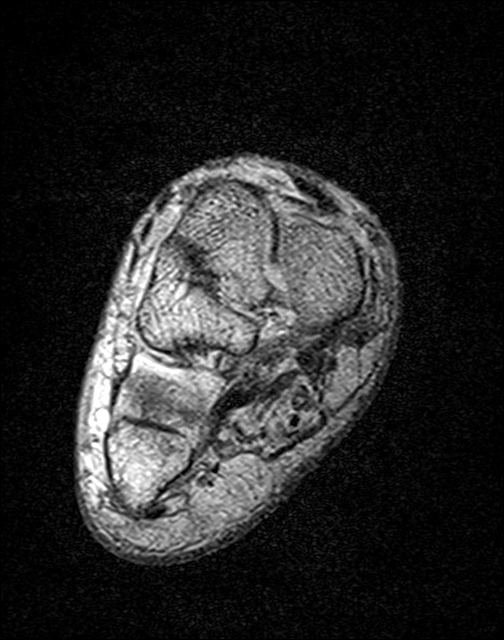

[Series 6: T2 fat-sat · coronal · 3.0mm · 0.19mm/px · 8 of 44 slices shown (1 of 2)]
[im 1/44]
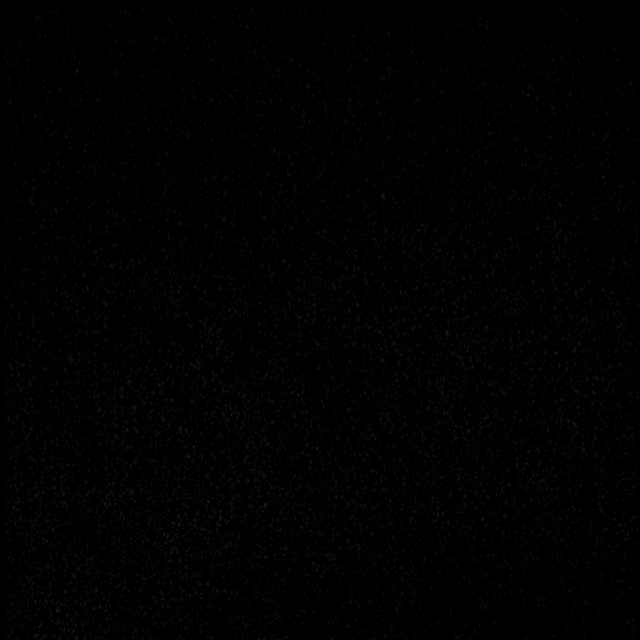
[im 8/44]
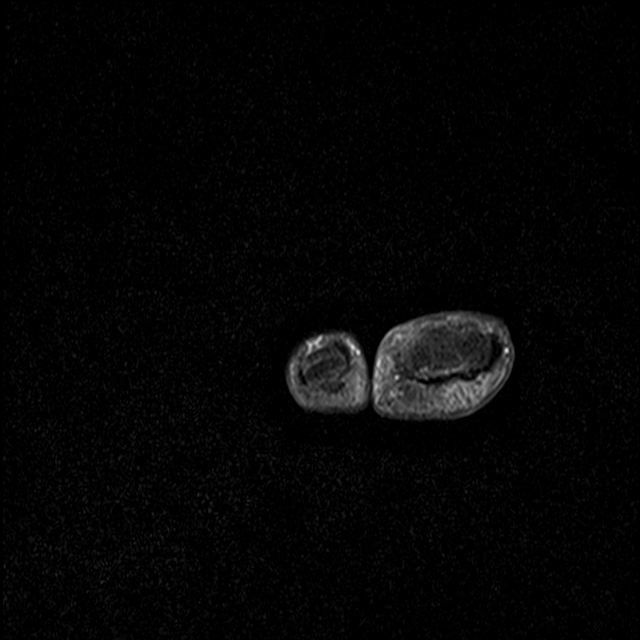
[im 12/44]
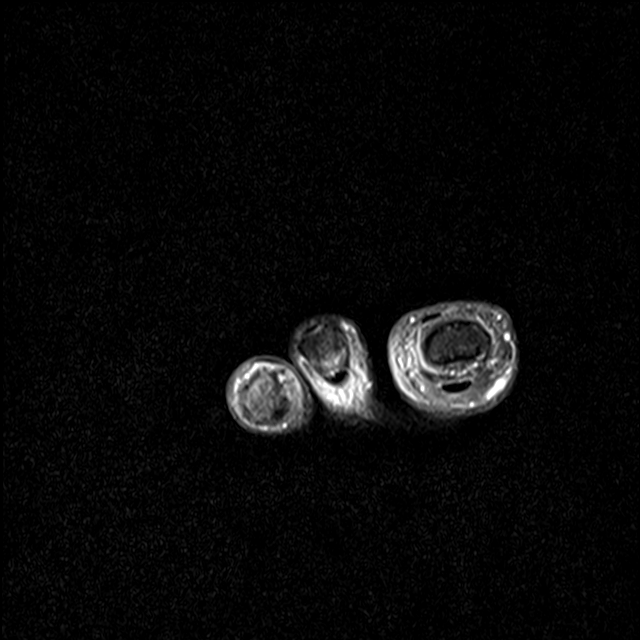
[im 20/44]
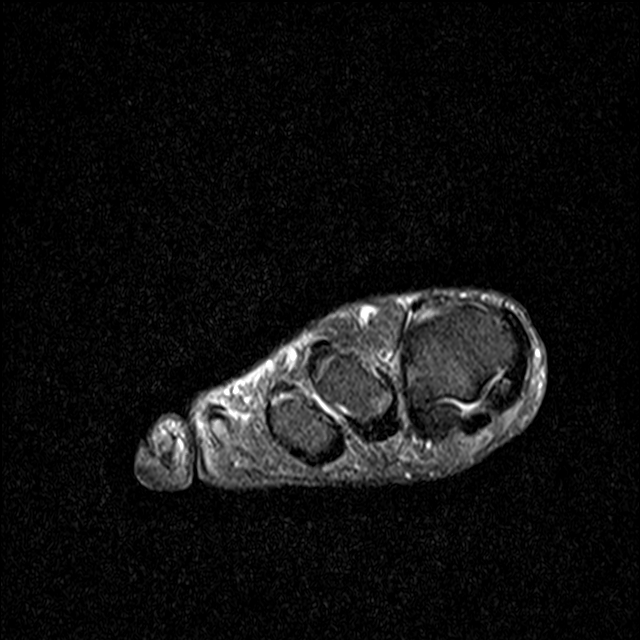
[im 24/44]
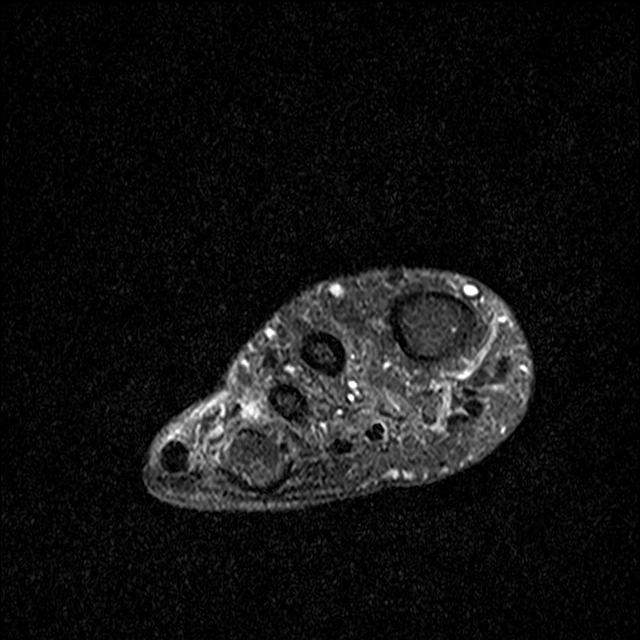
[im 32/44]
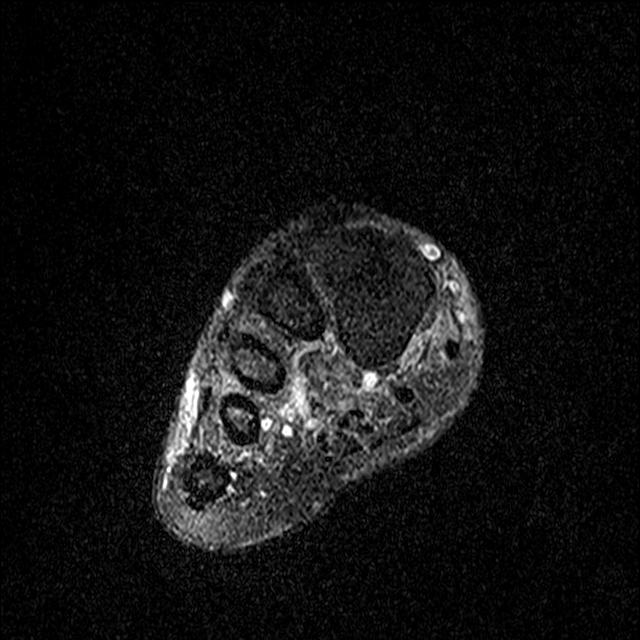
[im 36/44]
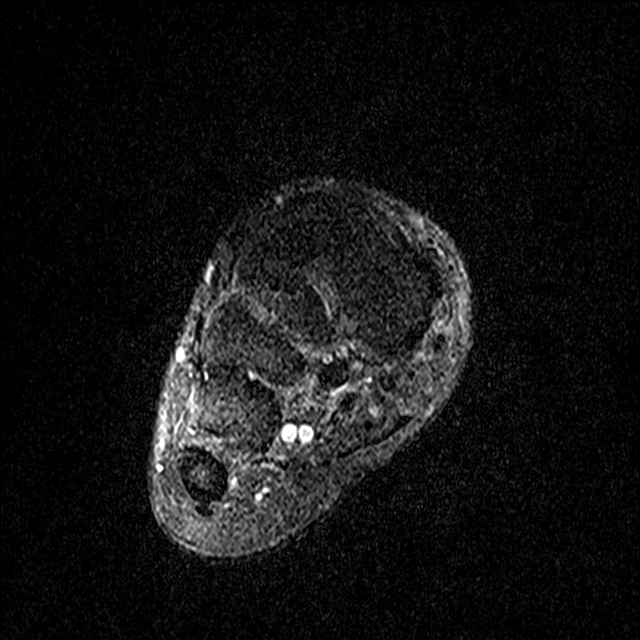
[im 40/44]
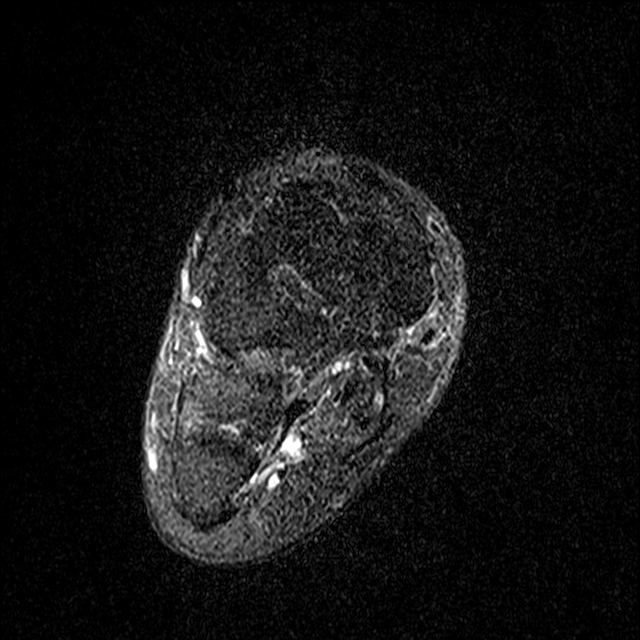

[Series 7: T2 fat-sat · axial · 3.0mm · 0.35mm/px · z∈[-87,-25]mm · 3 of 22 slices shown (2 of 2)]
[im 5/22]
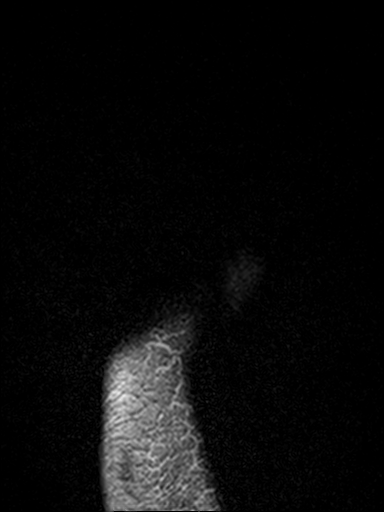
[im 13/22]
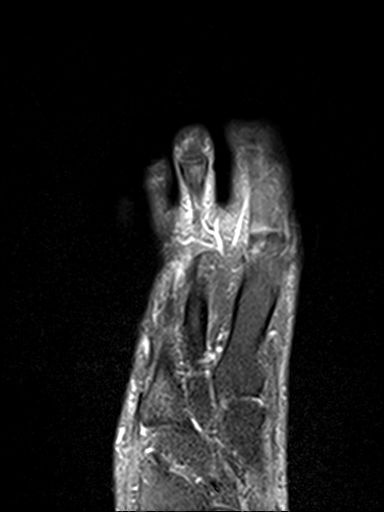
[im 22/22]
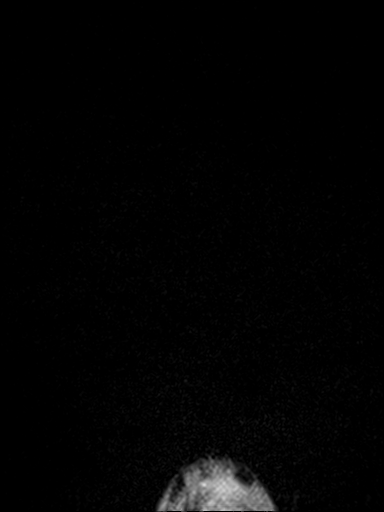

[Series 8: T1 · axial · 3.0mm · 0.35mm/px · z∈[-87,-25]mm · 3 of 22 slices shown (2 of 2)]
[im 5/22]
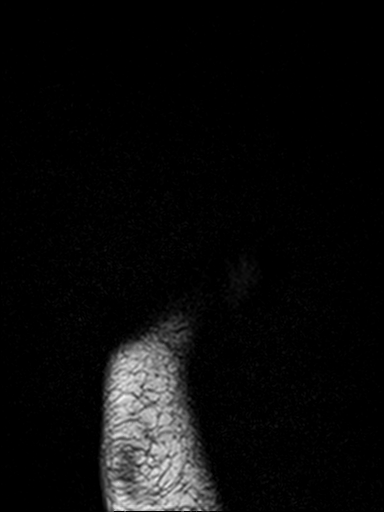
[im 13/22]
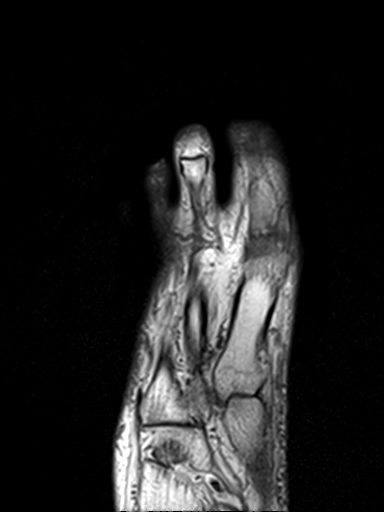
[im 22/22]
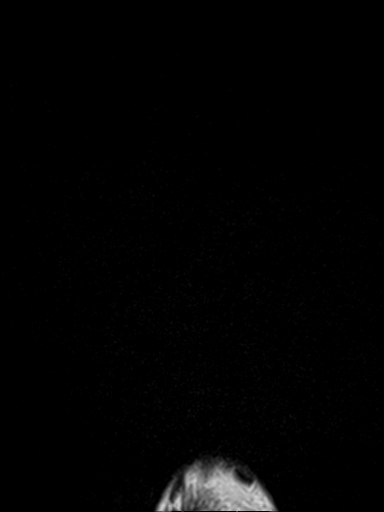

[17 of 40 positions shown; findings below may reference images not displayed]

FINDINGS: Bones/Joint/Cartilage

Susceptibility artifact from hardware in the fourth metatarsal head.
No definite bone marrow edema on STIR and T2 sequences to suggest
osteomyelitis.

Ligaments

Collateral ligaments are intact.  Lisfranc ligament is maintained.

Muscles and Tendons

Muscles are normal in signal and bulk.  No evidence of tendon tear.

Soft tissues

Subcutaneous soft tissue edema prominent about the dorsum and
plantar aspect of the fourth digit without evidence of fluid
collection or abscess.
IMPRESSION: 1. No definite MR evidence of acute osteomyelitis, susceptibility
artifact in the fourth metatarsal hard limits evaluation. Clinical
correlation is suggested.

2. Subcutaneous soft tissue edema about the fourth digit without
evidence of fluid collection or abscess.

## 2024-02-08 DIAGNOSIS — Z23 Encounter for immunization: Secondary | ICD-10-CM | POA: Diagnosis not present

## 2024-02-08 DIAGNOSIS — R059 Cough, unspecified: Secondary | ICD-10-CM | POA: Diagnosis not present

## 2024-02-08 DIAGNOSIS — E7800 Pure hypercholesterolemia, unspecified: Secondary | ICD-10-CM | POA: Diagnosis not present

## 2024-02-08 DIAGNOSIS — I10 Essential (primary) hypertension: Secondary | ICD-10-CM | POA: Diagnosis not present

## 2024-02-08 DIAGNOSIS — E113293 Type 2 diabetes mellitus with mild nonproliferative diabetic retinopathy without macular edema, bilateral: Secondary | ICD-10-CM | POA: Diagnosis not present
